# Patient Record
Sex: Male | Born: 1995 | Race: Black or African American | Hispanic: No | Marital: Single | Smoking: Current every day smoker
Health system: Southern US, Community
[De-identification: ages and names within clinical notes are randomized; demographics above are authoritative.]

---
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
## 2016-03-28 ENCOUNTER — Emergency Department (HOSPITAL_COMMUNITY)
Admission: EM | Admit: 2016-03-28 | Discharge: 2016-03-28 | Disposition: A | Discharge status: Home / Self Care | Payer: BLUE CROSS/BLUE SHIELD | Attending: Emergency Medicine | Admitting: Emergency Medicine

## 2016-03-28 ENCOUNTER — Emergency Department (HOSPITAL_COMMUNITY): Payer: BLUE CROSS/BLUE SHIELD

## 2016-03-28 ENCOUNTER — Encounter (HOSPITAL_COMMUNITY): Payer: Self-pay | Admitting: Oncology

## 2016-03-28 DIAGNOSIS — S0993XA Unspecified injury of face, initial encounter: Secondary | ICD-10-CM | POA: Diagnosis not present

## 2016-03-28 DIAGNOSIS — S0083XA Contusion of other part of head, initial encounter: Secondary | ICD-10-CM | POA: Diagnosis not present

## 2016-03-28 DIAGNOSIS — F17200 Nicotine dependence, unspecified, uncomplicated: Secondary | ICD-10-CM | POA: Diagnosis not present

## 2016-03-28 DIAGNOSIS — S8991XA Unspecified injury of right lower leg, initial encounter: Secondary | ICD-10-CM | POA: Diagnosis not present

## 2016-03-28 DIAGNOSIS — R519 Headache, unspecified: Secondary | ICD-10-CM

## 2016-03-28 DIAGNOSIS — Y92410 Unspecified street and highway as the place of occurrence of the external cause: Secondary | ICD-10-CM | POA: Insufficient documentation

## 2016-03-28 DIAGNOSIS — R51 Headache: Secondary | ICD-10-CM

## 2016-03-28 DIAGNOSIS — M79661 Pain in right lower leg: Secondary | ICD-10-CM

## 2016-03-28 DIAGNOSIS — S0990XA Unspecified injury of head, initial encounter: Secondary | ICD-10-CM | POA: Diagnosis present

## 2016-03-28 DIAGNOSIS — Y998 Other external cause status: Secondary | ICD-10-CM | POA: Diagnosis not present

## 2016-03-28 DIAGNOSIS — Y9389 Activity, other specified: Secondary | ICD-10-CM | POA: Diagnosis not present

## 2016-03-28 MED ORDER — NAPROXEN 500 MG PO TABS: 500.0000 mg | ORAL_TABLET | Freq: Two times a day (BID) | ORAL | Status: DC

## 2016-03-28 MED ORDER — METHOCARBAMOL 500 MG PO TABS: 500.0000 mg | ORAL_TABLET | Freq: Two times a day (BID) | ORAL | Status: DC
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
## 2016-03-28 NOTE — ED Notes (Signed)
EDP at bedside  
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
## 2016-03-30 ENCOUNTER — Encounter: Payer: Self-pay | Admitting: Family Medicine

## 2017-11-25 ENCOUNTER — Other Ambulatory Visit: Payer: Self-pay

## 2017-11-25 ENCOUNTER — Encounter (HOSPITAL_BASED_OUTPATIENT_CLINIC_OR_DEPARTMENT_OTHER): Payer: Self-pay

## 2017-11-25 ENCOUNTER — Emergency Department (HOSPITAL_BASED_OUTPATIENT_CLINIC_OR_DEPARTMENT_OTHER)
Admission: EM | Admit: 2017-11-25 | Discharge: 2017-11-25 | Disposition: A | Discharge status: Home / Self Care | Payer: BLUE CROSS/BLUE SHIELD | Attending: Emergency Medicine | Admitting: Emergency Medicine

## 2017-11-25 DIAGNOSIS — F17200 Nicotine dependence, unspecified, uncomplicated: Secondary | ICD-10-CM | POA: Insufficient documentation

## 2017-11-25 DIAGNOSIS — L03313 Cellulitis of chest wall: Secondary | ICD-10-CM | POA: Insufficient documentation

## 2017-11-25 MED ORDER — SULFAMETHOXAZOLE-TRIMETHOPRIM 800-160 MG PO TABS: 1.0000 | ORAL_TABLET | Freq: Two times a day (BID) | ORAL | 0 refills | Status: AC

## 2017-11-25 NOTE — Discharge Instructions (Signed)
You have been seen today in the Emergency Department (ED) for cellulitis, a superficial skin infection. Please take your antibiotics as prescribed for their ENTIRE prescribed duration.  Take Tylenol or Motrin as needed for pain, but only as written on the box.  ° °Please follow up with your doctor or in the ED in 24-48 hours for recheck of your infection if you are not improving.  Call your doctor sooner or return to the ED if you develop worsening signs of infection such as: increased redness, increased pain, pus, fever, or other symptoms that concern you. ° °

## 2017-11-25 NOTE — ED Provider Notes (Signed)
Emergency Department Provider Note   I have reviewed the triage vital signs and the nursing notes.   HISTORY  Chief Complaint Breast Mass   HPI Sean Bird is a 21 y.o. male presents to the emergency department for evaluation of redness and mild swelling around the left nipple.  Patient has some pain in the area.  No fevers or chills.  No nipple discharge.  No drainage from any other area.  Patient has tried cool compress with no relief in symptoms.  Redness is not significantly worsening but pain is increasing.  Pain worse with movement or examining the area.   History reviewed. No pertinent past medical history.  There are no active problems to display for this patient.   History reviewed. No pertinent surgical history.    Allergies Patient has no known allergies.  Family History  Problem Relation Age of Onset  . Down syndrome Sister   . Spina bifida Sister     Social History Social History   Tobacco Use  . Smoking status: Current Every Day Smoker  . Smokeless tobacco: Never Used  Substance Use Topics  . Alcohol use: Yes    Comment: occ  . Drug use: No    Review of Systems  Constitutional: No fever/chills Eyes: No visual changes. ENT: No sore throat. Cardiovascular: Denies chest pain. Respiratory: Denies shortness of breath. Gastrointestinal: No abdominal pain.  No nausea, no vomiting.  No diarrhea.  No constipation. Genitourinary: Negative for dysuria. Musculoskeletal: Negative for back pain. Skin: Negative for rash. Chest wall mass/redness/pain Neurological: Negative for headaches, focal weakness or numbness.  10-point ROS otherwise negative.  ____________________________________________   PHYSICAL EXAM:  VITAL SIGNS: ED Triage Vitals  Enc Vitals Group     BP 11/25/17 1953 128/84     Pulse Rate 11/25/17 1953 67     Resp 11/25/17 1953 16     Temp 11/25/17 1953 98.2 F (36.8 C)     Temp Source 11/25/17 1953 Oral     SpO2 11/25/17  1953 98 %     Weight 11/25/17 1953 201 lb (91.2 kg)     Height 11/25/17 1953 5\' 10"  (1.778 m)     Pain Score 11/25/17 1951 7   Constitutional: Alert and oriented. Well appearing and in no acute distress. Eyes: Conjunctivae are normal. Head: Atraumatic. Nose: No congestion/rhinnorhea. Mouth/Throat: Mucous membranes are moist. Neck: No stridor.  Cardiovascular:  Good peripheral circulation.  Respiratory: Normal respiratory effort.  Gastrointestinal: No distention.  Musculoskeletal: No lower extremity tenderness nor edema. No gross deformities of extremities. Neurologic:  Normal speech and language. No gross focal neurologic deficits are appreciated.  Skin:  Skin is warm, dry and intact. Mild redness surrounding the left nipple. No induration or fluctuance. Mild warmth.    ____________________________________________  RADIOLOGY  None ____________________________________________   PROCEDURES  Procedure(s) performed:   Procedures  EMERGENCY DEPARTMENT US SOFT TISSUE INTERPRETATION "Study: Limited Soft Tissue Ultrasound"  INDICATIONS: Pain and Soft tissue infection Multiple views of the body part were obtained in real-time with a multi-frequency linear probe PERFORMED BY:  Myself IMAGES ARCHIVED?: Yes SIDE:Left BODY PART:Chest wall FINDINGS: No abcess noted INTERPRETATION:  No abcess noted   CPT:  Chest wall [id number]  ____________________________________________   INITIAL IMPRESSION / ASSESSMENT AND PLAN / ED COURSE  Pertinent labs & imaging results that were available during my care of the patient were reviewed by me and considered in my medical decision making (see chart for details).  Patient presents the  emergency department for evaluation of redness surrounding the left nipple.  There is no abnormal nipple anatomy.  No drainage from the no fluctuance or induration.  There is mild erythema.  Performed a bedside ultrasound which shows no underlying fluid  collection.  Plan for antibiotics, warm compress, PCP follow-up as needed.  At this time, I do not feel there is any life-threatening condition present. I have reviewed and discussed all results (EKG, imaging, lab, urine as appropriate), exam findings with patient. I have reviewed nursing notes and appropriate previous records.  I feel the patient is safe to be discharged home without further emergent workup. Discussed usual and customary return precautions. Patient and family (if present) verbalize understanding and are comfortable with this plan.  Patient will follow-up with their primary care provider. If they do not have a primary care provider, information for follow-up has been provided to them. All questions have been answered.  ____________________________________________  FINAL CLINICAL IMPRESSION(S) / ED DIAGNOSES  Final diagnoses:  Cellulitis of chest wall     MEDICATIONS GIVEN DURING THIS VISIT:  None  NEW OUTPATIENT MEDICATIONS STARTED DURING THIS VISIT:  Bactrim   Note:  This document was prepared using Dragon voice recognition software and may include unintentional dictation errors.  Alona BeneJoshua Emryn Flanery, MD Emergency Medicine    Brenee Gajda, Arlyss RepressJoshua G, MD 11/26/17 [phone]

## 2017-11-25 NOTE — ED Notes (Signed)
Pt discharged to home with family. NAD.  

## 2017-11-25 NOTE — ED Triage Notes (Signed)
C/o left breast area mass x 2 days-NAD-steady gait

## 2017-11-25 NOTE — ED Notes (Signed)
ED Provider at bedside. 

## 2018-08-15 ENCOUNTER — Emergency Department (HOSPITAL_COMMUNITY): Payer: Self-pay

## 2018-08-15 ENCOUNTER — Other Ambulatory Visit: Payer: Self-pay

## 2018-08-15 ENCOUNTER — Emergency Department (HOSPITAL_COMMUNITY)
Admission: EM | Admit: 2018-08-15 | Discharge: 2018-08-16 | Disposition: A | Discharge status: Home / Self Care | Payer: Self-pay | Attending: Emergency Medicine | Admitting: Emergency Medicine

## 2018-08-15 ENCOUNTER — Encounter (HOSPITAL_COMMUNITY): Payer: Self-pay | Admitting: Emergency Medicine

## 2018-08-15 DIAGNOSIS — S82141A Displaced bicondylar fracture of right tibia, initial encounter for closed fracture: Secondary | ICD-10-CM | POA: Insufficient documentation

## 2018-08-15 DIAGNOSIS — Y929 Unspecified place or not applicable: Secondary | ICD-10-CM | POA: Insufficient documentation

## 2018-08-15 DIAGNOSIS — Y998 Other external cause status: Secondary | ICD-10-CM | POA: Insufficient documentation

## 2018-08-15 DIAGNOSIS — Y9389 Activity, other specified: Secondary | ICD-10-CM | POA: Insufficient documentation

## 2018-08-15 DIAGNOSIS — R1033 Periumbilical pain: Secondary | ICD-10-CM | POA: Insufficient documentation

## 2018-08-15 DIAGNOSIS — F17200 Nicotine dependence, unspecified, uncomplicated: Secondary | ICD-10-CM | POA: Insufficient documentation

## 2018-08-15 DIAGNOSIS — S83104A Unspecified dislocation of right knee, initial encounter: Secondary | ICD-10-CM | POA: Insufficient documentation

## 2018-08-15 MED ORDER — OXYCODONE-ACETAMINOPHEN 5-325 MG PO TABS
1.0000 | ORAL_TABLET | Freq: Once | ORAL | Status: AC
  Administered 2018-08-15: 1 via ORAL
  Filled 2018-08-15: qty 1

## 2018-08-15 MED ORDER — IBUPROFEN 400 MG PO TABS
400.0000 mg | ORAL_TABLET | Freq: Once | ORAL | Status: AC
  Administered 2018-08-15: 400 mg via ORAL
  Filled 2018-08-15: qty 1

## 2018-08-15 MED ORDER — SODIUM CHLORIDE 0.9 % IV BOLUS
1000.0000 mL | Freq: Once | INTRAVENOUS | Status: AC
  Administered 2018-08-15: 1000 mL via INTRAVENOUS
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
## 2018-08-15 NOTE — ED Notes (Signed)
Pt given fentanyl by EMS PTA.
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
## 2018-08-16 ENCOUNTER — Emergency Department (HOSPITAL_COMMUNITY): Payer: Self-pay

## 2018-08-16 LAB — CBC WITH DIFFERENTIAL/PLATELET
Abs Immature Granulocytes: 0 K/uL (ref 0.0–0.1)
Basophils Absolute: 0.1 K/uL (ref 0.0–0.1)
Basophils Relative: 0 %
Eosinophils Absolute: 0 K/uL (ref 0.0–0.7)
Eosinophils Relative: 0 %
HCT: 42.7 % (ref 39.0–52.0)
Hemoglobin: 13.7 g/dL (ref 13.0–17.0)
Immature Granulocytes: 0 %
Lymphocytes Relative: 10 %
Lymphs Abs: 1.2 K/uL (ref 0.7–4.0)
MCH: 27 pg (ref 26.0–34.0)
MCHC: 32.1 g/dL (ref 30.0–36.0)
MCV: 84.1 fL (ref 78.0–100.0)
Monocytes Absolute: 0.9 K/uL (ref 0.1–1.0)
Monocytes Relative: 7 %
Neutro Abs: 10.4 K/uL — ABNORMAL HIGH (ref 1.7–7.7)
Neutrophils Relative %: 83 %
Platelets: 262 K/uL (ref 150–400)
RBC: 5.08 MIL/uL (ref 4.22–5.81)
RDW: 12.6 % (ref 11.5–15.5)
WBC: 12.6 K/uL — ABNORMAL HIGH (ref 4.0–10.5)

## 2018-08-16 LAB — BASIC METABOLIC PANEL
Anion gap: 10 (ref 5–15)
BUN: 17 mg/dL (ref 6–20)
CHLORIDE: 101 mmol/L (ref 98–111)
CO2: 26 mmol/L (ref 22–32)
Calcium: 10 mg/dL (ref 8.9–10.3)
Creatinine, Ser: 0.97 mg/dL (ref 0.61–1.24)
GFR calc Af Amer: >60 mL/min (ref >60)
GFR calc non Af Amer: >60 mL/min (ref >60)
Glucose, Bld: 99 mg/dL (ref 70–99)
POTASSIUM: 3.7 mmol/L (ref 3.5–5.1)
SODIUM: 137 mmol/L (ref 135–145)

## 2018-08-16 LAB — URINALYSIS, ROUTINE W REFLEX MICROSCOPIC
Bilirubin Urine: NEGATIVE
Glucose, UA: NEGATIVE mg/dL
Hgb urine dipstick: NEGATIVE
Ketones, ur: NEGATIVE mg/dL
Leukocytes, UA: NEGATIVE
NITRITE: NEGATIVE
Protein, ur: NEGATIVE mg/dL
Specific Gravity, Urine: 1.026 (ref 1.005–1.030)
pH: 6 (ref 5.0–8.0)

## 2018-08-16 MED ORDER — IOPAMIDOL (ISOVUE-300) INJECTION 61%
100.0000 mL | Freq: Once | INTRAVENOUS | Status: AC | PRN
  Administered 2018-08-16: 100 mL via INTRAVENOUS

## 2018-08-16 MED ORDER — OXYCODONE-ACETAMINOPHEN 5-325 MG PO TABS: 1.0000 | ORAL_TABLET | Freq: Four times a day (QID) | ORAL | 0 refills | Status: AC | PRN

## 2018-08-16 MED ORDER — SODIUM CHLORIDE 0.9 % IV SOLN
1000.0000 mL | INTRAVENOUS | Status: DC
  Administered 2018-08-16: 1000 mL via INTRAVENOUS

## 2018-08-16 MED ORDER — OXYCODONE-ACETAMINOPHEN 5-325 MG PO TABS
1.0000 | ORAL_TABLET | Freq: Once | ORAL | Status: AC
  Administered 2018-08-16: 1 via ORAL
  Filled 2018-08-16: qty 1

## 2018-08-16 MED ORDER — SODIUM CHLORIDE 0.9 % IV BOLUS (SEPSIS)
1000.0000 mL | Freq: Once | INTRAVENOUS | Status: AC
  Administered 2018-08-16: 1000 mL via INTRAVENOUS

## 2018-08-16 MED ORDER — IOPAMIDOL (ISOVUE-370) INJECTION 76%
100.0000 mL | Freq: Once | INTRAVENOUS | Status: AC | PRN
  Administered 2018-08-16: 100 mL via INTRAVENOUS

## 2018-08-16 MED ORDER — SODIUM CHLORIDE 0.9 % IV BOLUS
500.0000 mL | Freq: Once | INTRAVENOUS | Status: AC
  Administered 2018-08-16: 500 mL via INTRAVENOUS

## 2018-08-16 MED ORDER — NAPROXEN 500 MG PO TABS
500.0000 mg | ORAL_TABLET | Freq: Two times a day (BID) | ORAL | 0 refills | Status: AC | PRN
Start: 2018-08-16 — End: ?

## 2018-08-16 MED ORDER — IOPAMIDOL (ISOVUE-300) INJECTION 61%
INTRAVENOUS | Status: AC
  Filled 2018-08-16: qty 100
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
## 2018-08-16 NOTE — Discharge Instructions (Addendum)
Your found to have a fracture of your lateral tibial plateau.  This requires close follow-up with orthopedics.  You have been placed in a knee immobilizer.  This should be worn at all times, but may be removed only for showering/bathing.  Use crutches when walking to prevent from putting any weight on your right leg.  You should ensure that you do not put any weight on your right leg to promote healing of your broken bone.  Ice your knee 3-4 times per day for 15 to 20 minutes each time.  We recommend the use of Percocet for management of pain.  Should your pain becomes severe, you may use naproxen as prescribed.  Do not drive or drink alcohol after taking Percocet as it may make you drowsy and impair your judgment.  Call the office of Dr. Dion SaucierLandau of orthopedics this morning to schedule a close follow-up visit.  Return for new or concerning symptoms.

## 2018-08-16 NOTE — ED Notes (Signed)
Pt. Unable to sign. Pt. Did not have any further questions. 
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
## 2018-08-16 NOTE — ED Notes (Signed)
CT contacted about Angio scan, they have to talk with radiology due to patient already receiving CT contrast with previous scans.

## 2018-08-16 NOTE — ED Notes (Signed)
Pt. To CT via wheelchair

## 2019-04-24 ENCOUNTER — Emergency Department (HOSPITAL_BASED_OUTPATIENT_CLINIC_OR_DEPARTMENT_OTHER): Payer: BLUE CROSS/BLUE SHIELD

## 2019-04-24 ENCOUNTER — Other Ambulatory Visit: Payer: Self-pay

## 2019-04-24 ENCOUNTER — Emergency Department (HOSPITAL_BASED_OUTPATIENT_CLINIC_OR_DEPARTMENT_OTHER)
Admission: EM | Admit: 2019-04-24 | Discharge: 2019-04-24 | Disposition: A | Discharge status: Home / Self Care | Payer: BLUE CROSS/BLUE SHIELD | Attending: Emergency Medicine | Admitting: Emergency Medicine

## 2019-04-24 ENCOUNTER — Encounter (HOSPITAL_BASED_OUTPATIENT_CLINIC_OR_DEPARTMENT_OTHER): Payer: Self-pay | Admitting: *Deleted

## 2019-04-24 DIAGNOSIS — F17200 Nicotine dependence, unspecified, uncomplicated: Secondary | ICD-10-CM | POA: Diagnosis not present

## 2019-04-24 DIAGNOSIS — Z79899 Other long term (current) drug therapy: Secondary | ICD-10-CM | POA: Insufficient documentation

## 2019-04-24 DIAGNOSIS — M25461 Effusion, right knee: Secondary | ICD-10-CM | POA: Diagnosis not present

## 2019-04-24 DIAGNOSIS — R2241 Localized swelling, mass and lump, right lower limb: Secondary | ICD-10-CM | POA: Diagnosis present

## 2019-04-24 MED ORDER — IBUPROFEN 800 MG PO TABS: 800.0000 mg | ORAL_TABLET | Freq: Three times a day (TID) | ORAL | 0 refills | Status: AC

## 2019-04-24 MED FILL — IBUPROFEN 800 MG TAB: 800 | 4 days supply | Qty: 12 | Fill #0
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
## 2019-04-24 NOTE — ED Triage Notes (Signed)
Pt reports swelling to his right knee gradually over the last month. Had acl repair on 12/5. amb with quick steady gait in nad, denies any pain.

## 2019-04-24 NOTE — ED Notes (Signed)
ED Provider at bedside. 
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
## 2020-07-27 ENCOUNTER — Emergency Department: Admit: 2020-07-27 | Payer: TRICARE (CHAMPUS)

## 2020-07-27 ENCOUNTER — Inpatient Hospital Stay
Admit: 2020-07-27 | Discharge: 2020-07-27 | Disposition: A | Discharge status: Home / Self Care | Payer: TRICARE (CHAMPUS) | Attending: Emergency Medicine

## 2020-07-27 DIAGNOSIS — R42 Dizziness and giddiness: Secondary | ICD-10-CM

## 2020-07-27 LAB — EKG 12-LEAD
Atrial Rate: 88 {beats}/min
Diagnosis: NORMAL
P Axis: 62 degrees
P-R Interval: 174 ms
Q-T Interval: 316 ms
QRS Duration: 90 ms
QTc Calculation (Bazett): 382 ms
R Axis: 64 degrees
T Axis: 43 degrees
Ventricular Rate: 88 {beats}/min

## 2020-07-27 LAB — CBC WITH AUTO DIFFERENTIAL
Basophils %: 1 % (ref 0–2)
Basophils Absolute: 0.1 10*3/uL (ref 0.0–0.1)
Eosinophils %: 3 % (ref 0–5)
Eosinophils Absolute: 0.2 10*3/uL (ref 0.0–0.4)
Hematocrit: 45.6 % (ref 36.0–48.0)
Hemoglobin: 14.5 g/dL (ref 13.0–16.0)
Lymphocytes %: 8 % — ABNORMAL LOW (ref 21–52)
Lymphocytes Absolute: 0.5 10*3/uL — ABNORMAL LOW (ref 0.9–3.6)
MCH: 27.2 PG (ref 24.0–34.0)
MCHC: 31.8 g/dL (ref 31.0–37.0)
MCV: 85.6 FL (ref 74.0–97.0)
MPV: 10.8 FL (ref 9.2–11.8)
Monocytes %: 13 % — ABNORMAL HIGH (ref 3–10)
Monocytes Absolute: 0.8 10*3/uL (ref 0.05–1.2)
Neutrophils %: 74 % — ABNORMAL HIGH (ref 40–73)
Neutrophils Absolute: 4.8 10*3/uL (ref 1.8–8.0)
Platelets: 260 10*3/uL (ref 135–420)
RBC: 5.33 M/uL (ref 4.35–5.65)
RDW: 12.4 % (ref 11.6–14.5)
WBC: 6.4 10*3/uL (ref 4.6–13.2)

## 2020-07-27 LAB — COMPREHENSIVE METABOLIC PANEL
ALT: 23 U/L (ref 16–61)
AST: 15 U/L (ref 10–38)
Albumin/Globulin Ratio: 1 (ref 0.8–1.7)
Albumin: 4.1 g/dL (ref 3.4–5.0)
Alkaline Phosphatase: 70 U/L (ref 45–117)
Anion Gap: 2 mmol/L — ABNORMAL LOW (ref 3.0–18)
BUN: 16 MG/DL (ref 7.0–18)
Bun/Cre Ratio: 15 (ref 12–20)
CO2: 32 mmol/L (ref 21–32)
Calcium: 9.8 MG/DL (ref 8.5–10.1)
Chloride: 104 mmol/L (ref 100–111)
Creatinine: 1.07 MG/DL (ref 0.6–1.3)
EGFR IF NonAfrican American: >60 mL/min/{1.73_m2} (ref >60)
GFR African American: >60 mL/min/{1.73_m2} (ref >60)
Globulin: 4.1 g/dL — ABNORMAL HIGH (ref 2.0–4.0)
Glucose: 77 mg/dL (ref 74–99)
Potassium: 4 mmol/L (ref 3.5–5.5)
Sodium: 138 mmol/L (ref 136–145)
Total Bilirubin: 0.7 MG/DL (ref 0.2–1.0)
Total Protein: 8.2 g/dL (ref 6.4–8.2)

## 2020-07-27 LAB — CARDIAC PANEL
CK-MB: <1 ng/ml (ref <3.6)
Total CK: 166 U/L (ref 39–308)
Troponin I: <0.02 NG/ML (ref 0.0–0.045)

## 2020-07-27 LAB — URINALYSIS W/ RFLX MICROSCOPIC
Bilirubin, Urine: NEGATIVE
Bilirubin: NEGATIVE
Blood, Urine: NEGATIVE
Blood: NEGATIVE
Glucose, Ur: NEGATIVE mg/dL
Glucose: NEGATIVE mg/dL
Ketone: NEGATIVE mg/dL
Ketones, Urine: NEGATIVE mg/dL
Leukocyte Esterase, Urine: NEGATIVE
Leukocyte Esterase: NEGATIVE
Nitrite, Urine: NEGATIVE
Nitrites: NEGATIVE
Protein, UA: NEGATIVE mg/dL
Protein: NEGATIVE mg/dL
Specific Gravity, UA: 1.027 (ref 1.005–1.030)
Specific gravity: 1.027 (ref 1.005–1.030)
Urobilinogen, UA, POCT: 1 EU/dL (ref 0.2–1.0)
Urobilinogen: 1 EU/dL (ref 0.2–1.0)
pH (UA): 7.5 (ref 5.0–8.0)
pH, UA: 7.5 (ref 5.0–8.0)

## 2020-07-27 LAB — POC GROUP A STREP
GROUP A STREP-POC,POCGPA: NEGATIVE
Group A strep (POC): NEGATIVE

## 2020-07-27 LAB — COVID-19

## 2020-07-27 LAB — CBC WITH AUTOMATED DIFF
ABS. BASOPHILS: 0.1 10*3/uL (ref 0.0–0.1)
ABS. EOSINOPHILS: 0.2 10*3/uL (ref 0.0–0.4)
ABS. LYMPHOCYTES: 0.5 10*3/uL — ABNORMAL LOW (ref 0.9–3.6)
ABS. MONOCYTES: 0.8 10*3/uL (ref 0.05–1.2)
ABS. NEUTROPHILS: 4.8 10*3/uL (ref 1.8–8.0)
BASOPHILS: 1 % (ref 0–2)
EOSINOPHILS: 3 % (ref 0–5)
HCT: 45.6 % (ref 36.0–48.0)
HGB: 14.5 g/dL (ref 13.0–16.0)
LYMPHOCYTES: 8 % — ABNORMAL LOW (ref 21–52)
MCH: 27.2 PG (ref 24.0–34.0)
MCHC: 31.8 g/dL (ref 31.0–37.0)
MCV: 85.6 FL (ref 74.0–97.0)
MONOCYTES: 13 % — ABNORMAL HIGH (ref 3–10)
MPV: 10.8 FL (ref 9.2–11.8)
NEUTROPHILS: 74 % — ABNORMAL HIGH (ref 40–73)
PLATELET: 260 10*3/uL (ref 135–420)
RBC: 5.33 M/uL (ref 4.35–5.65)
RDW: 12.4 % (ref 11.6–14.5)
WBC: 6.4 10*3/uL (ref 4.6–13.2)

## 2020-07-27 LAB — EKG, 12 LEAD, INITIAL
Atrial Rate: 88 {beats}/min
Calculated P Axis: 62 degrees
Calculated R Axis: 64 degrees
Calculated T Axis: 43 degrees
Diagnosis: NORMAL
P-R Interval: 174 ms
Q-T Interval: 316 ms
QRS Duration: 90 ms
QTC Calculation (Bezet): 382 ms
Ventricular Rate: 88 {beats}/min

## 2020-07-27 LAB — METABOLIC PANEL, COMPREHENSIVE
A-G Ratio: 1 (ref 0.8–1.7)
ALT (SGPT): 23 U/L (ref 16–61)
AST (SGOT): 15 U/L (ref 10–38)
Albumin: 4.1 g/dL (ref 3.4–5.0)
Alk. phosphatase: 70 U/L (ref 45–117)
Anion gap: 2 mmol/L — ABNORMAL LOW (ref 3.0–18)
BUN/Creatinine ratio: 15 (ref 12–20)
BUN: 16 MG/DL (ref 7.0–18)
Bilirubin, total: 0.7 MG/DL (ref 0.2–1.0)
CO2: 32 mmol/L (ref 21–32)
Calcium: 9.8 MG/DL (ref 8.5–10.1)
Chloride: 104 mmol/L (ref 100–111)
Creatinine: 1.07 MG/DL (ref 0.6–1.3)
GFR est AA: >60 mL/min/{1.73_m2} (ref >60)
GFR est non-AA: >60 mL/min/{1.73_m2} (ref >60)
Globulin: 4.1 g/dL — ABNORMAL HIGH (ref 2.0–4.0)
Glucose: 77 mg/dL (ref 74–99)
Potassium: 4 mmol/L (ref 3.5–5.5)
Protein, total: 8.2 g/dL (ref 6.4–8.2)
Sodium: 138 mmol/L (ref 136–145)

## 2020-07-27 LAB — SARS-COV-2

## 2020-07-27 LAB — CARDIAC PANEL,(CK, CKMB & TROPONIN)
CK - MB: <1 ng/ml (ref <3.6)
CK: 166 U/L (ref 39–308)
Troponin-I, QT: <0.02 NG/ML (ref 0.0–0.045)

## 2020-07-27 MED ORDER — LACTATED RINGERS BOLUS IV
Freq: Once | INTRAVENOUS | Status: AC
Start: 2020-07-27 — End: 2020-07-27
  Administered 2020-07-27: 18:00:00 via INTRAVENOUS

## 2020-07-27 MED ORDER — ALBUTEROL SULFATE HFA 90 MCG/ACTUATION AEROSOL INHALER
90 mcg/actuation | RESPIRATORY_TRACT | 1 refills | Status: AC | PRN
Start: 2020-07-27 — End: ?

## 2020-07-27 MED ORDER — INHALATIONAL SPACING DEVICE
0 refills | Status: AC | PRN
Start: 2020-07-27 — End: ?

## 2020-07-27 MED ORDER — IBUPROFEN 600 MG TAB
600 mg | ORAL_TABLET | Freq: Four times a day (QID) | ORAL | 0 refills | Status: AC | PRN
Start: 2020-07-27 — End: ?

## 2020-07-27 MED ORDER — KETOROLAC TROMETHAMINE 30 MG/ML INJECTION
30 mg/mL (1 mL) | INTRAMUSCULAR | Status: AC
Start: 2020-07-27 — End: 2020-07-27
  Administered 2020-07-27: 18:00:00 via INTRAVENOUS

## 2020-07-27 MED FILL — KETOROLAC TROMETHAMINE 30 MG/ML INJECTION: 30 mg/mL (1 mL) | INTRAMUSCULAR | Qty: 1

## 2020-07-27 MED FILL — LACTATED RINGERS IV: INTRAVENOUS | Qty: 1000

## 2020-07-27 NOTE — ED Notes (Signed)
Patient verbalized d/c instructions.

## 2020-07-27 NOTE — ED Provider Notes (Signed)
ED Provider Notes by Richardine Service, PA-C at 07/27/20 1319                Author: Selinda Flavin  Service: Emergency Medicine  Author Type: Physician Assistant       Filed: 07/27/20 1845  Date of Service: 07/27/20 1319  Status: Attested           Editor: Selinda Flavin (Physician Assistant)  Cosigner: Samson Frederic, MD at 07/27/20 1847          Attestation signed by Samson Frederic, MD at 07/27/20 1847          6:47 PM   I was personally available for consultation in the emergency department. I personally did not see the patient. I have reviewed the chart and the documentation recorded by the APP, including the assessment, treatment plan, and disposition.      Samson Frederic, MD                                       EMERGENCY DEPARTMENT HISTORY AND PHYSICAL EXAM      Date: 07/27/2020   Patient Name: Zachary Roberson        History of Presenting Illness          Chief Complaint       Patient presents with        ?  Concern For COVID-19 (Coronavirus)              History Provided By: Patient      Chief Complaint: COVID exposure      HPI(Context):    1:19 PM   Nataniel D Reckart is a 24 y.o.  male who presents to the emergency department C/O possible COVID exposure. Associated sxs include loss of taste/smell, body aches, sore throat, and lightheadedness. Pt denies known exposure to COVID-19.  Pt denies fever, chills, CP, SOB, nausea, vomiting, diarrhea, and any other sxs or complaints. Pt is nonsmoker. No resp history. Pt is not vaccinated for COVID-19.       PCP: None              Past History        Past Medical History:   History reviewed. No pertinent past medical history.      Past Surgical History:   History reviewed. No pertinent surgical history.      Family History:   History reviewed. No pertinent family history.      Social History:     Social History          Tobacco Use         ?  Smoking status:  Never Smoker     ?  Smokeless tobacco:  Never Used       Substance Use Topics          ?  Alcohol use:  Not Currently         ?  Drug use:  Not Currently           Allergies:   No Known Allergies           Review of Systems     Review of Systems    Constitutional: Positive for fatigue. Negative for chills and fever.    HENT: Positive for congestion and sore throat .          Endorses loss  of taste/smell       Respiratory: Negative for cough and shortness of breath.     Cardiovascular: Negative for chest pain and palpitations.    Gastrointestinal: Negative for nausea and vomiting.    Musculoskeletal: Positive for myalgias.    Neurological: Positive for light-headedness. Negative for syncope and headaches.    All other systems reviewed and are negative.           Physical Exam          Vitals:            07/27/20 1227  07/27/20 1345  07/27/20 1400          BP:  (!) 150/88  (!) 147/79  135/82     Pulse:  (!) 103  94  85     Resp:  '18  25  22     ' Temp:  97.5 ??F (36.4 ??C)         SpO2:  99%  100%  98%     Weight:  88.5 kg (195 lb)              Height:  '5\' 10"'  (1.778 m)            Physical Exam   Vitals and nursing note reviewed.   Constitutional:        General: He is not in acute distress.     Appearance: He is well-developed. He is not diaphoretic.      Comments: AA male in NAD. Alert. anxious    HENT :       Head: Normocephalic and atraumatic.      Right Ear: External ear normal.      Left Ear: External ear normal.      Nose: Nose normal.   Eyes:       General: No scleral icterus.        Right eye: No discharge.         Left eye: No discharge.      Conjunctiva/sclera: Conjunctivae normal.   Cardiovascular :       Rate and Rhythm: Normal rate and regular rhythm.      Heart sounds: Normal heart sounds. No murmur heard.   No friction rub. No gallop.    Pulmonary:       Effort: Pulmonary effort is normal. No tachypnea, accessory muscle usage or respiratory distress.      Breath sounds: Normal breath sounds . No decreased breath sounds, wheezing, rhonchi or rales.   Musculoskeletal:          General: Normal  range of motion.      Cervical back: Normal range of motion.      Right lower leg: No edema.      Left lower leg: No edema.    Skin:      General: Skin is warm and dry.   Neurological :       Mental Status: He is alert and oriented to person, place, and time.    Psychiatric:         Judgment: Judgment normal.                     Diagnostic Study Results        Labs -         Recent Results (from the past 12 hour(s))     SARS-COV-2          Collection Time: 07/27/20  1:00 PM  Result  Value  Ref Range            SARS-CoV-2  Please find results under separate order          POC GROUP A STREP          Collection Time: 07/27/20  1:18 PM         Result  Value  Ref Range            Group A strep (POC)  Negative  NEG         EKG, 12 LEAD, INITIAL          Collection Time: 07/27/20  1:29 PM         Result  Value  Ref Range            Ventricular Rate  88  BPM       Atrial Rate  88  BPM       P-R Interval  174  ms       QRS Duration  90  ms       Q-T Interval  316  ms       QTC Calculation (Bezet)  382  ms       Calculated P Axis  62  degrees       Calculated R Axis  64  degrees       Calculated T Axis  43  degrees       Diagnosis                 Normal sinus rhythm   Possible Left atrial enlargement   RSR' or QR pattern in V1 suggests right ventricular conduction delay   Borderline ECG   Confirmed by Berton Lan MD, Mrugesh ([id number]) on 07/27/2020 2:41:43 PM          CBC WITH AUTOMATED DIFF          Collection Time: 07/27/20  1:30 PM         Result  Value  Ref Range            WBC  6.4  4.6 - 13.2 K/uL       RBC  5.33  4.35 - 5.65 M/uL       HGB  14.5  13.0 - 16.0 g/dL       HCT  45.6  36.0 - 48.0 %       MCV  85.6  74.0 - 97.0 FL       MCH  27.2  24.0 - 34.0 PG       MCHC  31.8  31.0 - 37.0 g/dL       RDW  12.4  11.6 - 14.5 %       PLATELET  260  135 - 420 K/uL       MPV  10.8  9.2 - 11.8 FL       NEUTROPHILS  74 (H)  40 - 73 %       LYMPHOCYTES  8 (L)  21 - 52 %       MONOCYTES  13 (H)  3 - 10 %       EOSINOPHILS  3  0 - 5 %        BASOPHILS  1  0 - 2 %       ABS. NEUTROPHILS  4.8  1.8 - 8.0 K/UL       ABS. LYMPHOCYTES  0.5 (L)  0.9 - 3.6 K/UL  ABS. MONOCYTES  0.8  0.05 - 1.2 K/UL       ABS. EOSINOPHILS  0.2  0.0 - 0.4 K/UL       ABS. BASOPHILS  0.1  0.0 - 0.1 K/UL       DF  AUTOMATED          METABOLIC PANEL, COMPREHENSIVE          Collection Time: 07/27/20  1:30 PM         Result  Value  Ref Range            Sodium  138  136 - 145 mmol/L       Potassium  4.0  3.5 - 5.5 mmol/L       Chloride  104  100 - 111 mmol/L       CO2  32  21 - 32 mmol/L       Anion gap  2 (L)  3.0 - 18 mmol/L       Glucose  77  74 - 99 mg/dL       BUN  16  7.0 - 18 MG/DL       Creatinine  1.07  0.6 - 1.3 MG/DL       BUN/Creatinine ratio  15  12 - 20         GFR est AA  >60  >60 ml/min/1.70m       GFR est non-AA  >60  >60 ml/min/1.713m      Calcium  9.8  8.5 - 10.1 MG/DL       Bilirubin, total  0.7  0.2 - 1.0 MG/DL       ALT (SGPT)  23  16 - 61 U/L       AST (SGOT)  15  10 - 38 U/L       Alk. phosphatase  70  45 - 117 U/L       Protein, total  8.2  6.4 - 8.2 g/dL       Albumin  4.1  3.4 - 5.0 g/dL       Globulin  4.1 (H)  2.0 - 4.0 g/dL       A-G Ratio  1.0  0.8 - 1.7         CARDIAC PANEL,(CK, CKMB & TROPONIN)          Collection Time: 07/27/20  1:30 PM         Result  Value  Ref Range            CK - MB  <1.0  <3.6 ng/ml       CK-MB Index    0.0 - 4.0 %             CALCULATION NOT PERFORMED WHEN RESULT IS BELOW LINEAR LIMIT            CK  166  39 - 308 U/L       Troponin-I, QT  <0.02  0.0 - 0.045 NG/ML       URINALYSIS W/ RFLX MICROSCOPIC          Collection Time: 07/27/20  1:36 PM         Result  Value  Ref Range            Color  YELLOW          Appearance  CLEAR          Specific gravity  1.027  1.005 - 1.030  pH (UA)  7.5  5.0 - 8.0         Protein  Negative  NEG mg/dL       Glucose  Negative  NEG mg/dL       Ketone  Negative  NEG mg/dL       Bilirubin  Negative  NEG         Blood  Negative  NEG         Urobilinogen  1.0  0.2 - 1.0 EU/dL        Nitrites  Negative  NEG              Leukocyte Esterase  Negative  NEG                     XR CHEST PORT    (Results Pending)     6:43 PM   RADIOLOGY FINDINGS   CXR shows NAP.    Pending review by Radiologist   Recorded by Dionne Bucy, PA-C     CT Results   (Last 48 hours)          None                 CXR Results   (Last 48 hours)          None                  Medications given in the ED-     Medications       lactated ringers bolus infusion 1,000 mL (0 mL IntraVENous IV Completed 07/27/20 1424)       ketorolac (TORADOL) injection 30 mg (30 mg IntraVENous Given 07/27/20 1337)                Medical Decision Making     I am the first provider for this patient.      I reviewed the vital signs, available nursing notes, past medical history, past surgical history, family history and social history.      Vital Signs-Reviewed the patient's vital signs.      Pulse Oximetry Analysis - 98% on RA. NORMAL       Records Reviewed: Nursing Notes      Provider Notes (Medical Decision Making): COVID, dehydration, PNA, strep, URI      Procedures:   Procedures      ED Course:    1:19 PM Initial assessment performed. The patients presenting problems have been discussed, and they are in agreement with the care plan formulated and outlined with them.  I have encouraged them to ask questions as they arise throughout their visit.        Diagnosis and Disposition           Afebrile. Lungs CTAB. Rapid strep neg. CXR clear. SARS-COV-2 in process. Will instruct isolation and await results. Reasons to RTED discussed with pt. All questions answered. Pt feels comfortable going  home at this time. Pt expressed understanding and he agrees with plan.               1.  Episodic lightheadedness         2.  Person under investigation for COVID-19            PLAN:   1. D/C Home   2.      Discharge Medication List as of 07/27/2020  2:21 PM              START taking these medications  Details        ibuprofen (MOTRIN) 600 mg tablet  Take 1 Tablet by  mouth every six (6) hours as needed for Pain. Take with food., Normal, Disp-20 Tablet, R-0               albuterol (PROVENTIL HFA, VENTOLIN HFA, PROAIR HFA) 90 mcg/actuation inhaler  Take 2 Puffs by inhalation every four (4) hours as needed for Wheezing or Shortness of Breath., Normal, Disp-1 Inhaler, R-1               inhalational spacing device  1 Each by Does Not Apply route as needed (to be used with albuterol HFA.). Please dispense one adult spacer, Normal, Disp-1 Device, R-0                      3.      Follow-up Information               Follow up With  Specialties  Details  Why  [facility]        [address]   [place]   [phone]              [facility] EMERGENCY DEPT  Emergency Medicine      [place]   [phone]             _______________________________      Attestations:   This note is prepared by Dionne Bucy, PA-C.   _______________________________            Please note that this dictation was completed with Dragon, the computer voice recognition software.  Quite often unanticipated grammatical, syntax, homophones, and other interpretive errors are  inadvertently transcribed by the computer software.  Please disregard these errors.  Please excuse any errors that have escaped final proofreading.

## 2020-07-27 NOTE — ED Notes (Signed)
Patient ambulatory into ER c/o generalized body aches, sore throat that started yesterday. Patient denies any N/V/D, exposure to anyone positive w/ COVID. Patient states he does not want to leave and feels miserable.

## 2020-07-28 NOTE — Progress Notes (Signed)
 Patient contacted regarding COVID-19 Suspect. Discussed COVID-19 related testing which was pending at this time. Test results were pending. Patient informed of results, if available? n/a. Outreach made within 2 business days of discharge: YES    LPN Care Coordinator contacted the patient by telephone to perform post discharge assessment. Verified name and DOB with patient as identifiers. Provided introduction to self, and explanation of LPN Care Coordinator role, and reason for call due to risk factors for infection and/or exposure to COVID-19.     Symptoms reviewed with patient who verbalized the following symptoms: no new symptoms, no worsening symptoms and Patient states he feels fine today . Denies SOB, chest pain, cough, chills, fever, body aches, wheezing, lightheadness/dizziness,HA, n/v/d at this time.      Due to no new or worsening symptoms encounter was not routed to provider for escalation. Discussed follow-up appointments. If no appointment was previously scheduled, appointment scheduling offered: yes  [facility] follow up appointment(s): No future appointments.  Non-[facility] follow up appointment(s): n/a    Patient encouraged to make an appointment with PCP for f/up. Advised to return to ED if symptoms worsen or fail to improve. Patients acknowledges understanding.     Advance Care Planning:   Does patient have an Advance Directive: currently not on file; education provided      Patient has following risk factors of: COVID-19 Positive Exposure .     LPN reviewed discharge instructions, medical action plan and red flags such as increased shortness of breath, increasing fever and signs of decompensation with patient who verbalized understanding.   Discussed exposure protocols and quarantine with CDC Guidelines "What to do if you are sick with coronavirus disease 2019." Patient was given an opportunity for questions and concerns. The patient agrees to contact the [facility] exposure line [phone], local health  department [place]  [facility]  [phone] and PCP office for questions related to their healthcare. LPN provided contact information for future needs.    Reviewed and educated patient on any new and changed medications related to discharge diagnosis.      Plan for follow-up call in 14 days based on severity of symptoms and risk factors.

## 2020-07-28 NOTE — ACP (Advance Care Planning) (Signed)
Advance Care Planning:   Does patient have an Advance Directive:  currently not on file; education provided

## 2020-07-30 LAB — CULTURE, THROAT
Culture result:: NORMAL
Culture: NORMAL

## 2020-07-31 LAB — COVID-19: SARS-CoV-2: NOT DETECTED

## 2020-07-31 LAB — NOVEL CORONAVIRUS (COVID-19): SARS-CoV-2: NOT DETECTED

## 2020-09-26 ENCOUNTER — Inpatient Hospital Stay
Admit: 2020-09-26 | Discharge: 2020-09-26 | Disposition: A | Discharge status: Home / Self Care | Payer: TRICARE (CHAMPUS) | Attending: Emergency Medicine

## 2020-09-26 DIAGNOSIS — R197 Diarrhea, unspecified: Secondary | ICD-10-CM

## 2020-09-26 LAB — COVID-19

## 2020-09-26 LAB — SARS-COV-2

## 2020-09-26 NOTE — ED Notes (Signed)
Patient reports stomach pains that started yesterday and work concerned for COVID. Reports diarrhea  Denies COVID vaccination or exposure

## 2020-09-26 NOTE — ED Notes (Signed)
I have reviewed discharge instructions with the patient.  The patient verbalized understanding.  Patient armband removed and shredded

## 2020-09-26 NOTE — ED Provider Notes (Signed)
EMERGENCY DEPARTMENT HISTORY AND PHYSICAL EXAM    Date: 09/26/2020  Patient Name: Zachary Roberson    History of Presenting Illness     Chief Complaint   Patient presents with   ??? Abdominal Pain   ??? Concern For COVID-19 (Coronavirus)         History Provided By: Patient    11:50 AM  Zachary Roberson is a 24 y.o. male with PMHX of not vaccinated for COVID-19 who presents to the emergency department C/O diarrhea and abdominal pain. Per patient symptoms started yesterday. He reports the abdominal pain was worse yesterday and better today. He reports is in the center of his abdomen and rates it about a 5 out of 10. He denies any fever, chest pain, shortness of breath, cough, nausea, vomiting, urinary complaints. He reports that his work sent him in for Covid test. No clear relieving or exacerbating factors identified. No prior abdominal surgeries.     PCP: None    Current Outpatient Medications   Medication Sig Dispense Refill   ??? ibuprofen (MOTRIN) 600 mg tablet Take 1 Tablet by mouth every six (6) hours as needed for Pain. Take with food. 20 Tablet 0   ??? albuterol (PROVENTIL HFA, VENTOLIN HFA, PROAIR HFA) 90 mcg/actuation inhaler Take 2 Puffs by inhalation every four (4) hours as needed for Wheezing or Shortness of Breath. 1 Inhaler 1   ??? inhalational spacing device 1 Each by Does Not Apply route as needed (to be used with albuterol HFA.). Please dispense one adult spacer 1 Device 0       Past History     Past Medical History:  No past medical history on file.    Past Surgical History:  No past surgical history on file.    Family History:  No family history on file.    Social History:  Social History     Tobacco Use   ??? Smoking status: Never Smoker   ??? Smokeless tobacco: Never Used   Substance Use Topics   ??? Alcohol use: Not Currently   ??? Drug use: Not Currently       Allergies:  No Known Allergies      Review of Systems   Review of Systems   Constitutional: Negative for fever.   Respiratory: Negative for shortness of  breath.    Cardiovascular: Negative for chest pain.   Gastrointestinal: Positive for abdominal pain and diarrhea. Negative for nausea and vomiting.   All other systems reviewed and are negative.        Physical Exam     Vitals:    09/26/20 1046   BP: (!) 142/88   Pulse: 67   Resp: 16   Temp: 98.3 ??F (36.8 ??C)   SpO2: 97%     Physical Exam    Nursing notes and vital signs reviewed    Constitutional: Non toxic appearing, moderate distress  Head: Normocephalic, Atraumatic  Eyes: EOMI  Neck: Supple  Cardiovascular: Regular rate and rhythm, no murmurs, rubs, or gallops  Chest: Normal work of breathing and chest excursion bilaterally  Lungs: Clear to ausculation bilaterally  Abdomen: Soft, non tender, non distended, normoactive bowel sounds  Back: No evidence of trauma or deformity  Extremities: No evidence of trauma or deformity, no LE edema  Skin: Warm and dry, normal cap refill  Neuro: Alert and appropriate  Psychiatric: Normal mood and affect      Diagnostic Study Results     Labs -  Recent Results (from the past 12 hour(s))   SARS-COV-2    Collection Time: 09/26/20 10:51 AM   Result Value Ref Range    SARS-CoV-2 Please find results under separate order         Radiologic Studies -   No orders to display     CT Results  (Last 48 hours)    None        CXR Results  (Last 48 hours)    None          Medications given in the ED-  Medications - No data to display      Medical Decision Making   I am the first provider for this patient.    I reviewed the vital signs, available nursing notes, past medical history, past surgical history, family history and social history.    Vital Signs-Reviewed the patient's vital signs.    Pulse Oximetry Analysis - 97% on room air, not hypoxic     Records Reviewed: Nursing Notes    Provider Notes (Medical Decision Making): Zachary Roberson is a 24 y.o. male presenting for abdominal pain and diarrhea. Benign abdominal exam and hemodynamically stable.  Patient is hemodynamically stable  without evidence of respiratory distress.  Not hypoxic on room air.  Covid-19 testing was sent and is pending.  Plan for discharge with strict quarantine instructions and return precautions.  Patient understands and agrees with this plan.    Procedures:  Procedures    ED Course:       Diagnosis and Disposition     Critical Care: None    DISCHARGE NOTE:    Zachary Roberson  results have been reviewed with him.  He has been counseled regarding his diagnosis, treatment, and plan.  He verbally conveys understanding and agreement of the signs, symptoms, diagnosis, treatment and prognosis and additionally agrees to follow up as discussed.  He also agrees with the care-plan and conveys that all of his questions have been answered.  I have also provided discharge instructions for him that include: educational information regarding their diagnosis and treatment, and list of reasons why they would want to return to the ED prior to their follow-up appointment, should his condition change. He has been provided with education for proper emergency department utilization.     CLINICAL IMPRESSION:    1. Diarrhea, unspecified type    2. Person under investigation for COVID-19        PLAN:  1. D/C Home  2.   Current Discharge Medication List        3.   Follow-up Information     Follow up With Specialties Details Why Contact Info    Hiotellis, Apostolos I, MD Family Medicine Schedule an appointment as soon as possible for a visit  [facility] [address]  [address]  [place] [address]  [phone]      [facility] EMERGENCY DEPT Emergency Medicine  If symptoms worsen [address]  [place] [address]  [phone]        _______________________________      Please note that this dictation was completed with Dragon, the computer voice recognition software.  Quite often unanticipated grammatical, syntax, homophones, and other interpretive errors are inadvertently transcribed by the computer software.   Please disregard these errors.  Please excuse any errors that have escaped final proofreading.

## 2020-09-27 LAB — SARS-COV-2 BY NAA: SARS-CoV-2, NAA: NOT DETECTED

## 2020-09-27 NOTE — Progress Notes (Signed)
Patient contacted regarding COVID-19 risk. Discussed COVID-19 related testing which was available at this time. Test results were negative. Patient informed of results, if available? yes.     Care Transition Nurse contacted the patient by telephone to perform post discharge assessment. Call within 2 business days of discharge: Yes Verified name and DOB with patient as identifiers. Provided introduction to self, and explanation of the CTN/ACM role, and reason for call due to risk factors for infection and/or exposure to COVID-19.     Symptoms reviewed with patient who verbalized the following symptoms: diarrhea and abdominal pain      Due to no new or worsening symptoms encounter was not routed to provider for escalation. Discussed follow-up appointments. If no appointment was previously scheduled, appointment scheduling offered:  yes.  [facility] follow up appointment(s): No future appointments.  Non-[facility] follow up appointment(s): Hiotellis- patient to call for appt    Interventions to address risk factors: Scheduled appointment with PCP-patient to make appt.     Advance Care Planning:   Does patient have an Advance Directive: decision makers updated.     Educated patient about risk for severe COVID-19 due to risk factors according to CDC guidelines. CTN reviewed discharge instructions, medical action plan and red flag symptoms with the patient who verbalized understanding. Discussed COVID vaccination status: yes. Patient is currently not vaccinated. Education provided on COVID-19 vaccination as appropriate. Discussed exposure protocols and quarantine with CDC Guidelines. Patient was given an opportunity to verbalize any questions and concerns and agrees to contact CTN or health care provider for questions related to their healthcare.    Reviewed and educated patient on any new and changed medications related to discharge diagnosis     Was patient discharged with a pulse oximeter? no Discussed and confirmed pulse oximeter  discharge instructions and when to notify provider or seek emergency care.    CTN provided contact information. Plan for follow-up call in 5-7 days based on severity of symptoms and risk factors.  Spoke wit patient and he stated that he is feeling better today. Explained the BRAT diet to him to have mild items until he had no pain and normal bowel habits again. Ale explained that COVID was not detected. Advised patient to wear a mask in public and keep his distance from others as much as possible. Also advised patient to drink plenty of fluid and to wipe things down in the home such as door knobs and faucets to keep germs down. Patient appreciated the call to follow up. Will contact again.

## 2020-10-11 NOTE — Progress Notes (Signed)
Patient resolved from COVID Care Transitions episode on 10/11/2020.  Discussed COVID-19 related testing which was available at this time. Test results were negative. Patient informed of results, if available? yes     Patient/family has been provided the following resources and education related to COVID-19:                         Signs, symptoms and red flags related to COVID-19            CDC exposure and quarantine guidelines            [facility] exposure contact - [phone]            Contact for their local Department of Health                 Patient currently reports that the following symptoms have improved:  no new symptoms and no worsening symptoms.    No further outreach scheduled with this CTN/ACM/LPN/HC/ MA.  Episode of Care resolved.  Patient has this CTN/ACM/LPN/HC/MA contact information if future needs arise.
